# Patient Record
Sex: Male | Born: 1985 | Race: Black or African American | Hispanic: No | Marital: Single | Smoking: Never smoker
Health system: Southern US, Community
[De-identification: ages and names within clinical notes are randomized; demographics above are authoritative.]

---
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
## 2011-02-12 ENCOUNTER — Emergency Department (INDEPENDENT_AMBULATORY_CARE_PROVIDER_SITE_OTHER): Payer: Self-pay

## 2011-02-12 ENCOUNTER — Emergency Department (HOSPITAL_BASED_OUTPATIENT_CLINIC_OR_DEPARTMENT_OTHER)
Admission: EM | Admit: 2011-02-12 | Discharge: 2011-02-12 | Disposition: A | Discharge status: Home / Self Care | Payer: Self-pay | Attending: Emergency Medicine | Admitting: Emergency Medicine

## 2011-02-12 DIAGNOSIS — M545 Low back pain, unspecified: Secondary | ICD-10-CM | POA: Insufficient documentation

## 2011-02-12 DIAGNOSIS — R042 Hemoptysis: Secondary | ICD-10-CM

## 2011-02-12 DIAGNOSIS — J45909 Unspecified asthma, uncomplicated: Secondary | ICD-10-CM | POA: Insufficient documentation

## 2011-02-12 DIAGNOSIS — R109 Unspecified abdominal pain: Secondary | ICD-10-CM

## 2011-02-12 DIAGNOSIS — R0789 Other chest pain: Secondary | ICD-10-CM

## 2011-02-12 DIAGNOSIS — R300 Dysuria: Secondary | ICD-10-CM

## 2011-02-12 LAB — CBC
HCT: 41.1 % (ref 39.0–52.0)
Hemoglobin: 14.6 g/dL (ref 13.0–17.0)
MCH: 29.9 pg (ref 26.0–34.0)
MCHC: 35.5 g/dL (ref 30.0–36.0)
MCV: 84.2 fL (ref 78.0–100.0)
Platelets: 209 10*3/uL (ref 150–400)
RBC: 4.88 MIL/uL (ref 4.22–5.81)
RDW: 13.1 % (ref 11.5–15.5)
WBC: 4.5 10*3/uL (ref 4.0–10.5)

## 2011-02-12 LAB — BASIC METABOLIC PANEL
CO2: 30 mEq/L (ref 19–32)
Chloride: 101 mEq/L (ref 96–112)
Potassium: 3.4 mEq/L — ABNORMAL LOW (ref 3.5–5.1)
Sodium: 142 mEq/L (ref 135–145)

## 2011-02-12 LAB — DIFFERENTIAL
Basophils Absolute: 0 10*3/uL (ref 0.0–0.1)
Basophils Relative: 1 % (ref 0–1)
Eosinophils Absolute: 0 10*3/uL (ref 0.0–0.7)
Eosinophils Relative: 0 % (ref 0–5)
Lymphocytes Relative: 34 % (ref 12–46)
Lymphs Abs: 1.6 10*3/uL (ref 0.7–4.0)
Monocytes Absolute: 0.5 10*3/uL (ref 0.1–1.0)
Monocytes Relative: 12 % (ref 3–12)
Neutro Abs: 2.4 10*3/uL (ref 1.7–7.7)
Neutrophils Relative %: 53 % (ref 43–77)

## 2011-02-12 LAB — URINALYSIS, ROUTINE W REFLEX MICROSCOPIC
Glucose, UA: NEGATIVE mg/dL
Hgb urine dipstick: NEGATIVE
Ketones, ur: 15 mg/dL — AB
Leukocytes, UA: NEGATIVE
Nitrite: NEGATIVE
Protein, ur: 100 mg/dL — AB
Specific Gravity, Urine: 1.021 (ref 1.005–1.030)
Urobilinogen, UA: 1 mg/dL (ref 0.0–1.0)
pH: 6 (ref 5.0–8.0)

## 2011-02-12 LAB — URINE MICROSCOPIC-ADD ON

## 2011-02-12 MED ORDER — IOHEXOL 300 MG/ML  SOLN
100.0000 mL | Freq: Once | INTRAMUSCULAR | Status: AC | PRN
  Administered 2011-02-12: 100 mL via INTRAVENOUS

## 2015-01-17 ENCOUNTER — Emergency Department (HOSPITAL_COMMUNITY)
Admission: EM | Admit: 2015-01-17 | Discharge: 2015-01-18 | Disposition: A | Discharge status: Home / Self Care | Payer: Self-pay | Attending: Emergency Medicine | Admitting: Emergency Medicine

## 2015-01-17 ENCOUNTER — Encounter (HOSPITAL_COMMUNITY): Payer: Self-pay | Admitting: *Deleted

## 2015-01-17 DIAGNOSIS — T148 Other injury of unspecified body region: Secondary | ICD-10-CM | POA: Insufficient documentation

## 2015-01-17 DIAGNOSIS — T148XXA Other injury of unspecified body region, initial encounter: Secondary | ICD-10-CM

## 2015-01-17 DIAGNOSIS — S63501A Unspecified sprain of right wrist, initial encounter: Secondary | ICD-10-CM | POA: Insufficient documentation

## 2015-01-17 DIAGNOSIS — Y9289 Other specified places as the place of occurrence of the external cause: Secondary | ICD-10-CM | POA: Insufficient documentation

## 2015-01-17 DIAGNOSIS — S3992XA Unspecified injury of lower back, initial encounter: Secondary | ICD-10-CM | POA: Insufficient documentation

## 2015-01-17 DIAGNOSIS — S0990XA Unspecified injury of head, initial encounter: Secondary | ICD-10-CM | POA: Insufficient documentation

## 2015-01-17 DIAGNOSIS — R1110 Vomiting, unspecified: Secondary | ICD-10-CM | POA: Insufficient documentation

## 2015-01-17 DIAGNOSIS — Y998 Other external cause status: Secondary | ICD-10-CM | POA: Insufficient documentation

## 2015-01-17 DIAGNOSIS — Y9389 Activity, other specified: Secondary | ICD-10-CM | POA: Insufficient documentation

## 2015-01-17 DIAGNOSIS — R52 Pain, unspecified: Secondary | ICD-10-CM

## 2015-01-17 MED ORDER — ONDANSETRON HCL 4 MG/2ML IJ SOLN
INTRAMUSCULAR | Status: AC
  Filled 2015-01-17: qty 2

## 2015-01-17 MED ORDER — TETANUS-DIPHTH-ACELL PERTUSSIS 5-2.5-18.5 LF-MCG/0.5 IM SUSP
0.5000 mL | Freq: Once | INTRAMUSCULAR | Status: DC
  Filled 2015-01-17: qty 0.5

## 2015-01-17 NOTE — ED Notes (Signed)
C/o rt wrist injury
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
## 2015-01-18 ENCOUNTER — Emergency Department (HOSPITAL_COMMUNITY): Payer: Self-pay

## 2015-01-18 MED ORDER — MORPHINE SULFATE 4 MG/ML IJ SOLN
4.0000 mg | Freq: Once | INTRAMUSCULAR | Status: AC
  Administered 2015-01-18: 4 mg via INTRAMUSCULAR
  Filled 2015-01-18: qty 1

## 2015-01-18 MED ORDER — ONDANSETRON 4 MG PO TBDP
8.0000 mg | ORAL_TABLET | Freq: Once | ORAL | Status: AC
  Administered 2015-01-18: 8 mg via ORAL
  Filled 2015-01-18: qty 2

## 2015-01-18 MED ORDER — HYDROMORPHONE HCL 1 MG/ML IJ SOLN
2.0000 mg | Freq: Once | INTRAMUSCULAR | Status: AC
  Administered 2015-01-18: 2 mg via INTRAMUSCULAR
  Filled 2015-01-18: qty 2

## 2015-01-18 NOTE — ED Notes (Addendum)
Patient returned from xray.  Patient continues with pain and nausea.  MD notified.

## 2015-01-18 NOTE — Discharge Instructions (Signed)

## 2015-01-18 NOTE — ED Notes (Signed)
Patient to xray and ct

## 2018-02-15 ENCOUNTER — Encounter (HOSPITAL_COMMUNITY): Payer: Self-pay | Admitting: Emergency Medicine

## 2018-02-15 ENCOUNTER — Emergency Department (HOSPITAL_COMMUNITY)
Admission: EM | Admit: 2018-02-15 | Discharge: 2018-02-15 | Disposition: A | Discharge status: Home / Self Care | Payer: Self-pay | Attending: Emergency Medicine | Admitting: Emergency Medicine

## 2018-02-15 DIAGNOSIS — R369 Urethral discharge, unspecified: Secondary | ICD-10-CM

## 2018-02-15 DIAGNOSIS — Z711 Person with feared health complaint in whom no diagnosis is made: Secondary | ICD-10-CM

## 2018-02-15 LAB — URINALYSIS, ROUTINE W REFLEX MICROSCOPIC
Bilirubin Urine: NEGATIVE
GLUCOSE, UA: NEGATIVE mg/dL
HGB URINE DIPSTICK: NEGATIVE
KETONES UR: NEGATIVE mg/dL
Nitrite: NEGATIVE
PH: 6 (ref 5.0–8.0)
PROTEIN: NEGATIVE mg/dL
Specific Gravity, Urine: 1.026 (ref 1.005–1.030)
WBC, UA: >50 WBC/hpf — ABNORMAL HIGH (ref 0–5)

## 2018-02-15 LAB — RPR: RPR Ser Ql: NONREACTIVE

## 2018-02-15 LAB — HIV ANTIBODY (ROUTINE TESTING W REFLEX): HIV Screen 4th Generation wRfx: NONREACTIVE

## 2018-02-15 LAB — GC/CHLAMYDIA PROBE AMP (~~LOC~~) NOT AT ARMC
CHLAMYDIA, DNA PROBE: POSITIVE — AB
NEISSERIA GONORRHEA: POSITIVE — AB

## 2018-02-15 MED ORDER — CEFTRIAXONE SODIUM 250 MG IJ SOLR
250.0000 mg | Freq: Once | INTRAMUSCULAR | Status: AC
  Administered 2018-02-15: 250 mg via INTRAMUSCULAR
  Filled 2018-02-15: qty 250

## 2018-02-15 MED ORDER — AZITHROMYCIN 250 MG PO TABS
1000.0000 mg | ORAL_TABLET | Freq: Once | ORAL | Status: AC
  Administered 2018-02-15: 1000 mg via ORAL
  Filled 2018-02-15: qty 4

## 2018-02-15 NOTE — ED Triage Notes (Signed)
Pt reports he has had dysuria and penile DC since having unprotected sex on Friday.

## 2018-02-15 NOTE — ED Provider Notes (Signed)
[facility] Provider Note   CSN: [id number] Arrival date & time: 02/15/18  0254     History   Chief Complaint Chief Complaint  Patient presents with  . Exposure to STD    HPI Adam Mckee is a 32 y.o. male.  HPI  This is a 32 year old male who presents with concerns for STDs.  Patient reports "she got me doc."  He reports that he had an unprotected sexual encounter on Sunday night.  Since that time he has noted penile discharge.  He has not noted any penile lesions.  No abdominal pain or systemic symptoms.  He denies fevers.  No history of STDs.  Denies dysuria.  History reviewed. No pertinent past medical history.  There are no active problems to display for this patient.   History reviewed. No pertinent surgical history.      Home Medications    Prior to Admission medications   Not on File    Family History No family history on file.  Social History Social History   Tobacco Use  . Smoking status: Never Smoker  . Smokeless tobacco: Never Used  Substance Use Topics  . Alcohol use: Not Currently  . Drug use: Yes    Types: Marijuana     Allergies   Patient has no known allergies.   Review of Systems Review of Systems  Constitutional: Negative for fever.  Respiratory: Negative for shortness of breath.   Cardiovascular: Negative for chest pain.  Gastrointestinal: Negative for abdominal pain.  Genitourinary: Positive for discharge. Negative for dysuria.  All other systems reviewed and are negative.    Physical Exam Updated Vital Signs BP 136/72 (BP Location: Right Arm)   Pulse 65   Temp 97.8 F (36.6 C) (Oral)   Resp 18   SpO2 100%   Physical Exam  Constitutional: He is oriented to person, place, and time. He appears well-developed and well-nourished.  HENT:  Head: Normocephalic and atraumatic.  Cardiovascular: Normal rate and regular rhythm.  Pulmonary/Chest: Effort normal. No respiratory distress.    Musculoskeletal: He exhibits no edema.  Neurological: He is alert and oriented to person, place, and time.  Skin: Skin is warm and dry.  Psychiatric: He has a normal mood and affect.  Nursing note and vitals reviewed.    ED Treatments / Results  Labs (all labs ordered are listed, but only abnormal results are displayed) Labs Reviewed  URINALYSIS, ROUTINE W REFLEX MICROSCOPIC - Abnormal; Notable for the following components:      Result Value   APPearance HAZY (*)    Leukocytes, UA MODERATE (*)    WBC, UA >50 (*)    Bacteria, UA RARE (*)    All other components within normal limits  HIV ANTIBODY (ROUTINE TESTING)  RPR  GC/CHLAMYDIA PROBE AMP ([place]    EKG None  Radiology No results found.  Procedures Procedures (including critical care time)  Medications Ordered in ED Medications  cefTRIAXone (ROCEPHIN) injection 250 mg (has no administration in time range)  azithromycin (ZITHROMAX) tablet 1,000 mg (has no administration in time range)     Initial Impression / Assessment and Plan / ED Course  I have reviewed the triage vital signs and the nursing notes.  Pertinent labs & imaging results that were available during my care of the patient were reviewed by me and considered in my medical decision making (see chart for details).     Patient presents with concerns for STD.  He is overall nontoxic-appearing vital signs are reassuring.  Patient deferred GU exam but provided a urine sample which was tested for gonorrhea and chlamydia.  He was treated with Rocephin and azithromycin.  We discussed with him safe sex practices and abstaining from sexual activity for the next 10 days.  After history, exam, and medical workup I feel the patient has been appropriately medically screened and is safe for discharge home. Pertinent diagnoses were discussed with the patient. Patient was given return precautions.   Final Clinical Impressions(s) / ED Diagnoses    Final diagnoses:  Concern about STD in male without diagnosis  Penile discharge    ED Discharge Orders    None       Ilze Roselli, Mayer Masker, MD 02/15/18 0403

## 2018-02-15 NOTE — Discharge Instructions (Addendum)
Seen today with concerns for STD.  You were tested and treated.  Abstain from sexual activity for the next 10 days.  Make sure to always use condoms.  Inform any partners of positive results.

## 2018-09-01 ENCOUNTER — Other Ambulatory Visit: Payer: Self-pay

## 2018-09-01 ENCOUNTER — Emergency Department (HOSPITAL_BASED_OUTPATIENT_CLINIC_OR_DEPARTMENT_OTHER)
Admission: EM | Admit: 2018-09-01 | Discharge: 2018-09-01 | Disposition: A | Discharge status: Home / Self Care | Payer: Self-pay | Attending: Emergency Medicine | Admitting: Emergency Medicine

## 2018-09-01 ENCOUNTER — Encounter (HOSPITAL_BASED_OUTPATIENT_CLINIC_OR_DEPARTMENT_OTHER): Payer: Self-pay | Admitting: *Deleted

## 2018-09-01 DIAGNOSIS — B3749 Other urogenital candidiasis: Secondary | ICD-10-CM | POA: Insufficient documentation

## 2018-09-01 DIAGNOSIS — A6001 Herpesviral infection of penis: Secondary | ICD-10-CM | POA: Insufficient documentation

## 2018-09-01 DIAGNOSIS — Z202 Contact with and (suspected) exposure to infections with a predominantly sexual mode of transmission: Secondary | ICD-10-CM | POA: Insufficient documentation

## 2018-09-01 LAB — URINALYSIS, ROUTINE W REFLEX MICROSCOPIC
Bilirubin Urine: NEGATIVE
GLUCOSE, UA: NEGATIVE mg/dL
HGB URINE DIPSTICK: NEGATIVE
Ketones, ur: NEGATIVE mg/dL
Leukocytes, UA: NEGATIVE
Nitrite: NEGATIVE
PROTEIN: NEGATIVE mg/dL
Specific Gravity, Urine: 1.01 (ref 1.005–1.030)
pH: 7.5 (ref 5.0–8.0)

## 2018-09-01 MED ORDER — AZITHROMYCIN 250 MG PO TABS
1000.0000 mg | ORAL_TABLET | Freq: Once | ORAL | Status: AC
  Administered 2018-09-01: 1000 mg via ORAL
  Filled 2018-09-01: qty 4

## 2018-09-01 MED ORDER — LIDOCAINE HCL (PF) 1 % IJ SOLN
INTRAMUSCULAR | Status: AC
  Administered 2018-09-01: 0.9 mL
  Filled 2018-09-01: qty 5

## 2018-09-01 MED ORDER — CEFTRIAXONE SODIUM 250 MG IJ SOLR
250.0000 mg | Freq: Once | INTRAMUSCULAR | Status: AC
  Administered 2018-09-01: 250 mg via INTRAMUSCULAR
  Filled 2018-09-01: qty 250

## 2018-09-01 MED ORDER — FLUCONAZOLE 150 MG PO TABS: ORAL_TABLET | ORAL | 0 refills | Status: DC

## 2018-09-01 MED ORDER — VALACYCLOVIR HCL 1 G PO TABS: 1000.0000 mg | ORAL_TABLET | Freq: Two times a day (BID) | ORAL | 0 refills | Status: AC

## 2018-09-01 NOTE — ED Triage Notes (Signed)
Pt c/o STD exposure , denies penis discharge

## 2018-09-01 NOTE — Discharge Instructions (Signed)
Take Diflucan for yeast infection once and repeat in 3 days if not resolved.  Take Valtrex until completed for HSV infection. You have been treated for gonorrhea and chlamydia today. You will be called in 3 days if any of your tests return positive. In that case, please make all of your sexual partners aware that they will need to be treated as well. Abstain from intercourse for one week until you have both been treated. Use condoms in the future to help prevent sexually transmitted disease and unwanted pregnancy. You can go to the health department in the future for free STD testing.  Please go to the health department if you test positive for HIV or syphilis.

## 2018-09-01 NOTE — ED Notes (Signed)
Pt refused the GC/Chlamydia swab.
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
## 2018-09-02 NOTE — ED Provider Notes (Signed)
[facility] EMERGENCY DEPARTMENT Provider Note   CSN: [id number] Arrival date & time: 09/01/18  1705     History   Chief Complaint Chief Complaint  Patient presents with  . Exposure to STD    HPI Adam Mckee is a 33 y.o. male who presents with a 3 to 4-week history of rash to his penis.  He also reports concern for STD exposure as his sexual partner was diagnosed with HSV and chlamydia.  He denies any penile discharge prior to urinating here, but did notice a little bit of mucus-like discharge.  He denies any dysuria.  Denies any abdominal pain, nausea, vomiting, testicular pain or swelling.  Patient reports that his sexual partner has also had a yeast infection recurrently lately.  HPI  History reviewed. No pertinent past medical history.  There are no active problems to display for this patient.   History reviewed. No pertinent surgical history.      Home Medications    Prior to Admission medications   Medication Sig Start Date End Date Taking? Authorizing Provider  fluconazole (DIFLUCAN) 150 MG tablet Take 150 mg once and repeat in 3 days if the rash is not improving. 09/01/18   Zac Torti, Waylan BogaAlexandra M, PA-C  valACYclovir (VALTREX) 1000 MG tablet Take 1 tablet (1,000 mg total) by mouth 2 (two) times daily. 09/01/18   Emi HolesLaw, Mylea Roarty M, PA-C    Family History History reviewed. No pertinent family history.  Social History Social History   Tobacco Use  . Smoking status: Never Smoker  . Smokeless tobacco: Never Used  Substance Use Topics  . Alcohol use: Not Currently  . Drug use: Yes    Types: Marijuana     Allergies   Patient has no known allergies.   Review of Systems Review of Systems  Constitutional: Negative for fever.  Gastrointestinal: Negative for abdominal pain, nausea and vomiting.  Genitourinary: Positive for genital sores. Negative for discharge, dysuria, penile pain, penile swelling, scrotal swelling and testicular pain.     Physical  Exam Updated Vital Signs BP 131/70 (BP Location: Left Arm)   Pulse 68   Resp 16   Ht 6\' 1"  (1.854 m)   Wt 68 kg   SpO2 98%   BMI 19.79 kg/m   Physical Exam Vitals signs and nursing note reviewed.  Constitutional:      General: He is not in acute distress.    Appearance: He is well-developed. He is not diaphoretic.  HENT:     Head: Normocephalic and atraumatic.     Mouth/Throat:     Pharynx: No oropharyngeal exudate.  Eyes:     General: No scleral icterus.       Right eye: No discharge.        Left eye: No discharge.     Conjunctiva/sclera: Conjunctivae normal.     Pupils: Pupils are equal, round, and reactive to light.  Neck:     Musculoskeletal: Normal range of motion and neck supple.     Thyroid: No thyromegaly.  Cardiovascular:     Rate and Rhythm: Normal rate and regular rhythm.     Heart sounds: Normal heart sounds. No murmur. No friction rub. No gallop.   Pulmonary:     Effort: Pulmonary effort is normal. No respiratory distress.     Breath sounds: Normal breath sounds. No stridor. No wheezing or rales.  Abdominal:     General: Bowel sounds are normal. There is no distension.     Palpations: Abdomen is soft.  Tenderness: There is no abdominal tenderness. There is no guarding or rebound.  Genitourinary:    Comments: Erythema discoloration noted to the head of the penis as well as few vesicular lesions, no discharge, no testicular tenderness or swelling to the scrotum, no erythema noted to scrotum Lymphadenopathy:     Cervical: No cervical adenopathy.  Skin:    General: Skin is warm and dry.     Coloration: Skin is not pale.     Findings: No rash.  Neurological:     Mental Status: He is alert.     Coordination: Coordination normal.      ED Treatments / Results  Labs (all labs ordered are listed, but only abnormal results are displayed) Labs Reviewed  URINALYSIS, ROUTINE W REFLEX MICROSCOPIC  RPR  HIV ANTIBODY (ROUTINE TESTING W REFLEX)  GC/CHLAMYDIA  PROBE AMP ([place]) NOT AT [facility]    EKG None  Radiology No results found.  Procedures Procedures (including critical care time)  Medications Ordered in ED Medications  cefTRIAXone (ROCEPHIN) injection 250 mg (250 mg Intramuscular Given 09/01/18 2013)  azithromycin (ZITHROMAX) tablet 1,000 mg (1,000 mg Oral Given 09/01/18 2012)  lidocaine (PF) (XYLOCAINE) 1 % injection (0.9 mLs  Given 09/01/18 2014)     Initial Impression / Assessment and Plan / ED Course  I have reviewed the triage vital signs and the nursing notes.  Pertinent labs & imaging results that were available during my care of the patient were reviewed by me and considered in my medical decision making (see chart for details).     Patient appears to have genital herpes outbreak as well as candidal infection.  Will treat for both as well as gonorrhea chlamydia considering patient's reported exposure to chlamydia.  GC/chlamydia sent and pending as well as HIV and RPR.  Patient discharged home with Valtrex, Diflucan.  Patient advised to be called in 3 days if positive for GC/chlamydia, HIV, RPR.  Referred to health department if positive for HIV or RPR.  Safe sex practices discussed.  Return precautions discussed.  Patient understands and agrees with plan.  Patient vital stable throughout ED course and discharged in satisfactory condition.  Final Clinical Impressions(s) / ED Diagnoses   Final diagnoses:  STD exposure  Herpes simplex infection of penis    ED Discharge Orders         Ordered    valACYclovir (VALTREX) 1000 MG tablet  2 times daily     09/01/18 2030    fluconazole (DIFLUCAN) 150 MG tablet     09/01/18 2030           Emi Holes, PA-C 09/02/18 0040    Sabas Sous, MD 09/02/18 1538

## 2018-09-03 LAB — RPR: RPR Ser Ql: NONREACTIVE

## 2018-09-03 LAB — HIV ANTIBODY (ROUTINE TESTING W REFLEX): HIV Screen 4th Generation wRfx: NONREACTIVE

## 2018-09-04 LAB — GC/CHLAMYDIA PROBE AMP (~~LOC~~) NOT AT ARMC
Chlamydia: POSITIVE — AB
Neisseria Gonorrhea: NEGATIVE

## 2019-04-22 ENCOUNTER — Emergency Department (HOSPITAL_COMMUNITY)
Admission: EM | Admit: 2019-04-22 | Discharge: 2019-04-22 | Disposition: A | Discharge status: Home / Self Care | Payer: Self-pay

## 2021-04-08 ENCOUNTER — Other Ambulatory Visit: Payer: Self-pay

## 2021-04-08 ENCOUNTER — Emergency Department (HOSPITAL_COMMUNITY): Payer: Self-pay

## 2021-04-08 ENCOUNTER — Emergency Department (HOSPITAL_COMMUNITY)
Admission: EM | Admit: 2021-04-08 | Discharge: 2021-04-09 | Disposition: A | Discharge status: Home / Self Care | Payer: Self-pay | Attending: Emergency Medicine | Admitting: Emergency Medicine

## 2021-04-08 ENCOUNTER — Encounter (HOSPITAL_COMMUNITY): Payer: Self-pay | Admitting: Emergency Medicine

## 2021-04-08 DIAGNOSIS — S82831B Other fracture of upper and lower end of right fibula, initial encounter for open fracture type I or II: Secondary | ICD-10-CM

## 2021-04-08 DIAGNOSIS — Y902 Blood alcohol level of 40-59 mg/100 ml: Secondary | ICD-10-CM | POA: Insufficient documentation

## 2021-04-08 DIAGNOSIS — S82831A Other fracture of upper and lower end of right fibula, initial encounter for closed fracture: Secondary | ICD-10-CM | POA: Insufficient documentation

## 2021-04-08 DIAGNOSIS — M79661 Pain in right lower leg: Secondary | ICD-10-CM | POA: Insufficient documentation

## 2021-04-08 DIAGNOSIS — W3400XA Accidental discharge from unspecified firearms or gun, initial encounter: Secondary | ICD-10-CM | POA: Insufficient documentation

## 2021-04-08 DIAGNOSIS — F10129 Alcohol abuse with intoxication, unspecified: Secondary | ICD-10-CM | POA: Insufficient documentation

## 2021-04-08 DIAGNOSIS — Z79899 Other long term (current) drug therapy: Secondary | ICD-10-CM | POA: Insufficient documentation

## 2021-04-08 DIAGNOSIS — Z23 Encounter for immunization: Secondary | ICD-10-CM | POA: Insufficient documentation

## 2021-04-08 LAB — COMPREHENSIVE METABOLIC PANEL
ALT: 17 U/L (ref 0–44)
AST: 21 U/L (ref 15–41)
Albumin: 4.2 g/dL (ref 3.5–5.0)
Alkaline Phosphatase: 62 U/L (ref 38–126)
Anion gap: 11 (ref 5–15)
BUN: 10 mg/dL (ref 6–20)
CO2: 24 mmol/L (ref 22–32)
Calcium: 9.5 mg/dL (ref 8.9–10.3)
Chloride: 105 mmol/L (ref 98–111)
Creatinine, Ser: 1.02 mg/dL (ref 0.61–1.24)
GFR, Estimated: >60 mL/min (ref >60)
Glucose, Bld: 92 mg/dL (ref 70–99)
Potassium: 3.3 mmol/L — ABNORMAL LOW (ref 3.5–5.1)
Sodium: 140 mmol/L (ref 135–145)
Total Bilirubin: 0.9 mg/dL (ref 0.3–1.2)
Total Protein: 6.9 g/dL (ref 6.5–8.1)

## 2021-04-08 LAB — CBC WITH DIFFERENTIAL/PLATELET
Abs Immature Granulocytes: 0.01 10*3/uL (ref 0.00–0.07)
Basophils Absolute: 0 10*3/uL (ref 0.0–0.1)
Basophils Relative: 1 %
Eosinophils Absolute: 0 10*3/uL (ref 0.0–0.5)
Eosinophils Relative: 1 %
HCT: 41.9 % (ref 39.0–52.0)
Hemoglobin: 14.6 g/dL (ref 13.0–17.0)
Immature Granulocytes: 0 %
Lymphocytes Relative: 48 %
Lymphs Abs: 3.1 10*3/uL (ref 0.7–4.0)
MCH: 31.1 pg (ref 26.0–34.0)
MCHC: 34.8 g/dL (ref 30.0–36.0)
MCV: 89.3 fL (ref 80.0–100.0)
Monocytes Absolute: 0.7 10*3/uL (ref 0.1–1.0)
Monocytes Relative: 11 %
Neutro Abs: 2.4 10*3/uL (ref 1.7–7.7)
Neutrophils Relative %: 39 %
Platelets: 202 10*3/uL (ref 150–400)
RBC: 4.69 MIL/uL (ref 4.22–5.81)
RDW: 13.7 % (ref 11.5–15.5)
WBC: 6.2 10*3/uL (ref 4.0–10.5)
nRBC: 0 % (ref 0.0–0.2)

## 2021-04-08 LAB — ETHANOL: Alcohol, Ethyl (B): 40 mg/dL — ABNORMAL HIGH (ref <10)

## 2021-04-08 MED ORDER — CEFAZOLIN SODIUM-DEXTROSE 2-4 GM/100ML-% IV SOLN
2.0000 g | INTRAVENOUS | Status: AC
  Administered 2021-04-08: 2 g via INTRAVENOUS
  Filled 2021-04-08: qty 100

## 2021-04-08 MED ORDER — FENTANYL CITRATE PF 50 MCG/ML IJ SOSY: 50.0000 ug | PREFILLED_SYRINGE | Freq: Once | INTRAMUSCULAR | Status: DC

## 2021-04-08 MED ORDER — TETANUS-DIPHTH-ACELL PERTUSSIS 5-2.5-18.5 LF-MCG/0.5 IM SUSY
0.5000 mL | PREFILLED_SYRINGE | Freq: Once | INTRAMUSCULAR | Status: AC
  Administered 2021-04-08: 0.5 mL via INTRAMUSCULAR
  Filled 2021-04-08: qty 0.5

## 2021-04-08 MED ORDER — SODIUM CHLORIDE 0.9 % IV BOLUS
1000.0000 mL | Freq: Once | INTRAVENOUS | Status: AC
  Administered 2021-04-08: 1000 mL via INTRAVENOUS
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
## 2021-04-09 ENCOUNTER — Emergency Department (HOSPITAL_COMMUNITY): Payer: Self-pay

## 2021-04-09 MED ORDER — CEPHALEXIN 500 MG PO CAPS: 500.0000 mg | ORAL_CAPSULE | Freq: Three times a day (TID) | ORAL | 0 refills | Status: DC

## 2021-04-09 MED ORDER — HYDROCODONE-ACETAMINOPHEN 5-325 MG PO TABS: 1.0000 | ORAL_TABLET | ORAL | 0 refills | Status: DC | PRN

## 2021-04-09 MED ORDER — IOHEXOL 350 MG/ML SOLN
100.0000 mL | Freq: Once | INTRAVENOUS | Status: AC | PRN
  Administered 2021-04-09: 100 mL via INTRAVENOUS

## 2021-04-09 MED ORDER — FENTANYL CITRATE (PF) 100 MCG/2ML IJ SOLN
INTRAMUSCULAR | Status: AC
  Administered 2021-04-09: 50 ug
  Filled 2021-04-09: qty 2

## 2021-04-09 MED ORDER — HYDROMORPHONE HCL 1 MG/ML IJ SOLN
1.0000 mg | Freq: Once | INTRAMUSCULAR | Status: AC
  Administered 2021-04-09: 1 mg via INTRAVENOUS
  Filled 2021-04-09: qty 1

## 2021-04-09 NOTE — Progress Notes (Signed)
Orthopedic Tech Progress Note Patient Details:  Akiem Urieta 1985/11/01 [id number]  Ortho Devices Type of Ortho Device: Knee Immobilizer, Crutches Ortho Device/Splint Location: RLE Ortho Device/Splint Interventions: Ordered, Application, Adjustment   Post Interventions Patient Tolerated: Well Instructions Provided: Adjustment of device, Care of device, Poper ambulation with device  Aniah Pauli 04/09/2021, 2:56 AM

## 2021-04-09 NOTE — Discharge Instructions (Addendum)
Take the antibiotics and pain medication as prescribed.  You may put weight on the leg as tolerated.  Follow-up with Dr. Jena Gauss for recheck next week.  Return to the ED for worsening pain, weakness, numbness, tingling, bleeding, drainage, or other concerns. Keep wound clean and dry

## 2021-09-21 ENCOUNTER — Encounter: Payer: Self-pay | Admitting: Emergency Medicine

## 2021-09-21 ENCOUNTER — Other Ambulatory Visit: Payer: Self-pay

## 2021-09-21 ENCOUNTER — Emergency Department
Admission: EM | Admit: 2021-09-21 | Discharge: 2021-09-21 | Disposition: A | Discharge status: Home / Self Care | Payer: BLUE CROSS/BLUE SHIELD | Attending: Emergency Medicine | Admitting: Emergency Medicine

## 2021-09-21 ENCOUNTER — Telehealth: Payer: Self-pay | Admitting: Emergency Medicine

## 2021-09-21 DIAGNOSIS — A64 Unspecified sexually transmitted disease: Secondary | ICD-10-CM

## 2021-09-21 DIAGNOSIS — Z202 Contact with and (suspected) exposure to infections with a predominantly sexual mode of transmission: Secondary | ICD-10-CM | POA: Diagnosis present

## 2021-09-21 LAB — CHLAMYDIA/NGC RT PCR (ARMC ONLY)
Chlamydia Tr: DETECTED — AB
N gonorrhoeae: DETECTED — AB

## 2021-09-21 LAB — RAPID HIV SCREEN (HIV 1/2 AB+AG)
HIV 1/2 Antibodies: NONREACTIVE
HIV-1 P24 Antigen - HIV24: NONREACTIVE

## 2021-09-21 MED ORDER — CEFTRIAXONE SODIUM 250 MG IJ SOLR
250.0000 mg | Freq: Once | INTRAMUSCULAR | Status: AC
  Administered 2021-09-21: 250 mg via INTRAMUSCULAR
  Filled 2021-09-21: qty 250

## 2021-09-21 MED ORDER — AZITHROMYCIN 500 MG PO TABS
1000.0000 mg | ORAL_TABLET | Freq: Once | ORAL | Status: AC
  Administered 2021-09-21: 1000 mg via ORAL
  Filled 2021-09-21: qty 2

## 2021-09-21 MED ORDER — ACYCLOVIR 400 MG PO TABS: 400.0000 mg | ORAL_TABLET | Freq: Three times a day (TID) | ORAL | 0 refills | Status: AC

## 2021-09-21 NOTE — ED Notes (Signed)
See triage note  presents requesting STD testing  states had unprotected sex

## 2021-09-21 NOTE — ED Provider Notes (Signed)
° °  [facility] Provider Note    Event Date/Time   First MD Initiated Contact with Patient 09/21/21 [phone]     (approximate)  History   Chief Complaint: SEXUALLY TRANSMITTED DISEASE  HPI  Adam Mckee is a 36 y.o. male who presents to the emergency department for concerns over STDs.  According to the patient he was diagnosed a little over a year ago with herpes.  States he recently had small little ulcer appear around his genitals.  He also states over the past week or so he has been experiencing penile discharge and believes he could have an STD.  Physical Exam   Triage Vital Signs: ED Triage Vitals  Enc Vitals Group     BP 09/21/21 0748 128/79     Pulse Rate 09/21/21 0748 71     Resp 09/21/21 0748 16     Temp 09/21/21 0748 98 F (36.7 C)     Temp Source 09/21/21 0748 Oral     SpO2 09/21/21 0748 98 %     Weight 09/21/21 0747 150 lb (68 kg)     Height 09/21/21 0747 6\' 1"  (1.854 m)     Head Circumference --      Peak Flow --      Pain Score 09/21/21 0747 0     Pain Loc --      Pain Edu? --      Excl. in GC? --     Most recent vital signs: Vitals:   09/21/21 0748  BP: 128/79  Pulse: 71  Resp: 16  Temp: 98 F (36.7 C)  SpO2: 98%    General: Awake, no distress.  CV:  Good peripheral perfusion.  Regular rate and rhythm  Resp:  Normal effort.  Equal breath sounds bilaterally.  Abd:  No distention.  Soft, nontender.  No rebound or guarding. Other:  Small mild abrasion/ulceration to the glans of the penis consistent with possible herpes outbreak.   ED Results / Procedures / Treatments    MEDICATIONS ORDERED IN ED: Medications  cefTRIAXone (ROCEPHIN) injection 250 mg (has no administration in time range)  azithromycin (ZITHROMAX) tablet 1,000 mg (has no administration in time range)     IMPRESSION / MDM / ASSESSMENT AND PLAN / ED COURSE  I reviewed the triage vital signs and the nursing notes.  Patient presents to the emergency department  for evaluation and treatment of possible STD.  Patient was diagnosed with herpes simplex approximately 1 year ago does appear to have a mild outbreak currently.  Also states recent unprotected sex and now has penile discharge.  We will treat with Zithromax and Rocephin.  We will send urine for chlamydia/gonorrhea.  We will place the patient on 7 days of acyclovir, he is also requesting HIV test which I believe is reasonable given his recent unprotected sex.  Patient will follow-up with his doctor to follow-up on test results.  Discussed abstinence x7 days following treatment of himself and his partner.  FINAL CLINICAL IMPRESSION(S) / ED DIAGNOSES   STD  Rx / DC Orders   Acyclovir, PCP follow-up  Note:  This document was prepared using Dragon voice recognition software and may include unintentional dictation errors.   11/19/21, MD 09/21/21 [phone]

## 2021-09-21 NOTE — Telephone Encounter (Signed)
Called to inform of std results.  He was treated during visit.  Left message asking him to call me.

## 2021-09-21 NOTE — ED Triage Notes (Signed)
Pt via POV from home. Pt wants to get checked for STDs. Pt had recent unprotected sex with a new partner since Saturday. Pt is A&Ox4 and NAD.

## 2021-09-23 NOTE — Telephone Encounter (Signed)
Called again to inform of std results.  Left message.
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
